# Patient Record
Sex: Female | Born: 1990 | Race: White | Hispanic: No | Marital: Single | Smoking: Never smoker
Health system: Southern US, Community
[De-identification: ages and names within clinical notes are randomized; demographics above are authoritative.]

## PROBLEM LIST (undated history)

## (undated) DIAGNOSIS — D649 Anemia, unspecified: Secondary | ICD-10-CM

## (undated) HISTORY — PX: TEMPOROMANDIBULAR JOINT SURGERY: SHX35

---

## 2016-10-22 ENCOUNTER — Emergency Department (HOSPITAL_BASED_OUTPATIENT_CLINIC_OR_DEPARTMENT_OTHER): Payer: 59

## 2016-10-22 ENCOUNTER — Emergency Department (HOSPITAL_BASED_OUTPATIENT_CLINIC_OR_DEPARTMENT_OTHER)
Admission: EM | Admit: 2016-10-22 | Discharge: 2016-10-22 | Disposition: A | Discharge status: Home / Self Care | Payer: 59 | Attending: Emergency Medicine | Admitting: Emergency Medicine

## 2016-10-22 ENCOUNTER — Encounter (HOSPITAL_BASED_OUTPATIENT_CLINIC_OR_DEPARTMENT_OTHER): Payer: Self-pay | Admitting: Emergency Medicine

## 2016-10-22 DIAGNOSIS — J029 Acute pharyngitis, unspecified: Secondary | ICD-10-CM | POA: Insufficient documentation

## 2016-10-22 DIAGNOSIS — R197 Diarrhea, unspecified: Secondary | ICD-10-CM | POA: Diagnosis not present

## 2016-10-22 DIAGNOSIS — Z79899 Other long term (current) drug therapy: Secondary | ICD-10-CM | POA: Diagnosis not present

## 2016-10-22 HISTORY — DX: Anemia, unspecified: D64.9

## 2016-10-22 LAB — RAPID STREP SCREEN (MED CTR MEBANE ONLY): Streptococcus, Group A Screen (Direct): NEGATIVE

## 2016-10-22 LAB — CBC WITH DIFFERENTIAL/PLATELET
BASOS PCT: 0 %
Basophils Absolute: 0 10*3/uL (ref 0.0–0.1)
EOS ABS: 0 10*3/uL (ref 0.0–0.7)
EOS PCT: 0 %
HCT: 42.7 % (ref 36.0–46.0)
Hemoglobin: 14.3 g/dL (ref 12.0–15.0)
Lymphocytes Relative: 12 %
Lymphs Abs: 1.3 10*3/uL (ref 0.7–4.0)
MCH: 30.6 pg (ref 26.0–34.0)
MCHC: 33.5 g/dL (ref 30.0–36.0)
MCV: 91.2 fL (ref 78.0–100.0)
MONO ABS: 0.9 10*3/uL (ref 0.1–1.0)
MONOS PCT: 8 %
NEUTROS PCT: 80 %
Neutro Abs: 8.7 10*3/uL — ABNORMAL HIGH (ref 1.7–7.7)
PLATELETS: 211 10*3/uL (ref 150–400)
RBC: 4.68 MIL/uL (ref 3.87–5.11)
RDW: 13.2 % (ref 11.5–15.5)
WBC: 10.8 10*3/uL — ABNORMAL HIGH (ref 4.0–10.5)

## 2016-10-22 LAB — MONONUCLEOSIS SCREEN: MONO SCREEN: NEGATIVE

## 2016-10-22 MED ORDER — SODIUM CHLORIDE 0.9 % IV BOLUS (SEPSIS)
1000.0000 mL | Freq: Once | INTRAVENOUS | Status: AC
  Administered 2016-10-22: 1000 mL via INTRAVENOUS

## 2016-10-22 NOTE — ED Provider Notes (Signed)
[facility] Note   CSN: [id number] Arrival date & time: 10/22/16  1510  By signing my name below, I, Modena JanskyAlbert Thayil, attest that this documentation has been prepared under the direction and in the presence of Rolan BuccoMelanie Lory Galan, MD . Electronically Signed: Modena JanskyAlbert Thayil, Scribe. 10/22/2016. 4:21 PM.  History   Chief Complaint Chief Complaint  Patient presents with  . Sore Throat   The history is provided by the patient. No language interpreter was used.   HPI Comments: Paige Pugh is a 25 y.o. female who presents to the Emergency Department complaining of a constant moderate sore throat that started 3 days ago. She states she had flu-like symptoms starting 2 weeks ago and she started taking antibiotic medication 10 days ago. She reports she has white pus in her throat. She took ibuprofen with minimal relief. She reports associated symptoms of congestion (nasal and chest), diaphoresis, diarrhea, decreased appetite, and recent weight loss (~10lbs over the past month). 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 She denies any nausea or vomiting.  She states that she has had recurrent infections over last year of flu, strept throat, impetigo and staph skin infections and is concerned about an underlying problem with her immune system.  PCP: [facility]  Past Medical History:  Diagnosis Date  . Anemia     There are no active problems to display for this patient.   Past Surgical History:  Procedure Laterality Date  . TEMPOROMANDIBULAR JOINT SURGERY      OB History    No data available       Home Medications    Prior to Admission medications   Medication Sig Start Date End Date Taking? Authorizing Provider  amphetamine-dextroamphetamine (ADDERALL) 5 MG tablet Take 5 mg by mouth daily as needed.   Yes Historical Provider, MD    Family History No family history on file.  Social History Social History    Substance Use Topics  . Smoking status: Never Smoker  . Smokeless tobacco: Never Used  . Alcohol use No     Allergies   Patient has no known allergies.   Review of Systems Review of Systems  Constitutional: Positive for appetite change, diaphoresis and unexpected weight change. Negative for chills, fatigue and fever.  HENT: Positive for congestion (Nasal, congestion). Negative for rhinorrhea and sneezing.   Eyes: Negative.   Respiratory: Negative for cough, chest tightness and shortness of breath.   Cardiovascular: Negative for chest pain and leg swelling.  Gastrointestinal: Positive for diarrhea. Negative for abdominal pain, blood in stool, nausea and vomiting.  Genitourinary: Negative for difficulty urinating, flank pain, frequency and hematuria.  Musculoskeletal: Negative for arthralgias and back pain.  Skin: Negative for rash.  Neurological: Negative for dizziness, speech difficulty, weakness, numbness and headaches.     Physical Exam Updated Vital Signs BP 105/69 (BP Location: Left Arm)   Pulse 107   Temp 98.4 F (36.9 C) (Oral)   Resp 20   Ht 5\' 6"  (1.676 m)   Wt 106 lb (48.1 kg)   LMP 10/19/2016 (Approximate)   SpO2 100%   BMI 17.11 kg/m   Physical Exam  Constitutional: She is oriented to person, place, and time. She appears well-developed and well-nourished.  HENT:  Head: Normocephalic and atraumatic.  Right Ear: Tympanic membrane normal.  Left Ear: Tympanic membrane normal.  Enlarged tonsils bilaterally with exudates. Uvula midline. No trismus.   Eyes: Pupils are equal, round,  and reactive to light.  Neck: Normal range of motion. Neck supple.  Cardiovascular: Normal rate, regular rhythm and normal heart sounds.   Pulmonary/Chest: Effort normal and breath sounds normal. No respiratory distress. She has no wheezes. She has no rales. She exhibits no tenderness.  Abdominal: Soft. Bowel sounds are normal. There is no tenderness. There is no rebound and no  guarding.  No obvious hepatosplenomegaly.   Musculoskeletal: Normal range of motion. She exhibits no edema.  Lymphadenopathy:    She has no cervical adenopathy.  Neurological: She is alert and oriented to person, place, and time.  Skin: Skin is warm and dry. No rash noted.  Psychiatric: She has a normal mood and affect.     ED Treatments / Results  DIAGNOSTIC STUDIES: Oxygen Saturation is 100% on RA, normal by my interpretation.    COORDINATION OF CARE: 4:28 PM- Pt advised of plan for treatment and pt agrees.  Labs (all labs ordered are listed, but only abnormal results are displayed) Labs Reviewed  CBC WITH DIFFERENTIAL/PLATELET - Abnormal; Notable for the following:       Result Value   WBC 10.8 (*)    Neutro Abs 8.7 (*)    All other components within normal limits  RAPID STREP SCREEN (NOT AT [facility])  CULTURE, GROUP A STREP [facility]  MONONUCLEOSIS SCREEN    EKG  EKG Interpretation None       Radiology Dg Chest 2 View  Result Date: 10/22/2016 CLINICAL DATA:  Diaphoresis, head throbbing, diarrhea, weight loss, pus in throat. EXAM: CHEST  2 VIEW COMPARISON:  None. FINDINGS: Trachea is midline. Heart size normal. Lungs are hyperinflated but clear. No pleural fluid. IMPRESSION: Hyperinflation without acute finding. Electronically Signed   By: Leanna BattlesMelinda  Blietz M.D.   On: 10/22/2016 16:49    Procedures Procedures (including critical care time)  Medications Ordered in ED Medications  sodium chloride 0.9 % bolus 1,000 mL (1,000 mLs Intravenous New Bag/Given 10/22/16 1707)     Initial Impression / Assessment and Plan / ED Course  I have reviewed the triage vital signs and the nursing notes.  Pertinent labs & imaging results that were available during my care of the patient were reviewed by me and considered in my medical decision making (see chart for details).  Clinical Course     Patient presents with pharyngitis symptoms. Given that she's had recurrent infections,  I did do some lab work including a CBC which showed only mildly elevated white count. Her Monospot is negative. Her chest x-ray is clear without evidence of pneumonia. She's feeling better after some IV fluids. She's afebrile with no suggestions of sepsis. Her heart rate has normalized. She was discharged home in good condition. She was encouraged to have follow-up with her PCP who is with [facility]. Return precautions were given.  Final Clinical Impressions(s) / ED Diagnoses   Final diagnoses:  Pharyngitis, unspecified etiology    New Prescriptions New Prescriptions   No medications on file   I personally performed the services described in this documentation, which was scribed in my presence.  The recorded information has been reviewed and considered.     Rolan BuccoMelanie Isabele Lollar, MD 10/22/16 [phone]

## 2016-10-22 NOTE — ED Triage Notes (Signed)
Pt reports dx of flu 2 weeks ago.  Pt states for past 3 days throat has been sore and decreased appetite along with profuse sweating.

## 2016-10-22 NOTE — ED Notes (Signed)
EDMD at bedside

## 2016-10-25 LAB — CULTURE, GROUP A STREP (THRC)

## 2018-05-19 IMAGING — CR DG CHEST 2V
2 series · 2 of 2 positions shown · non-contrast
Comparison: None.

CLINICAL DATA: Diaphoresis, head throbbing, diarrhea, weight loss,
pus in throat.

EXAM:
CHEST  2 VIEW

[w chest pa]
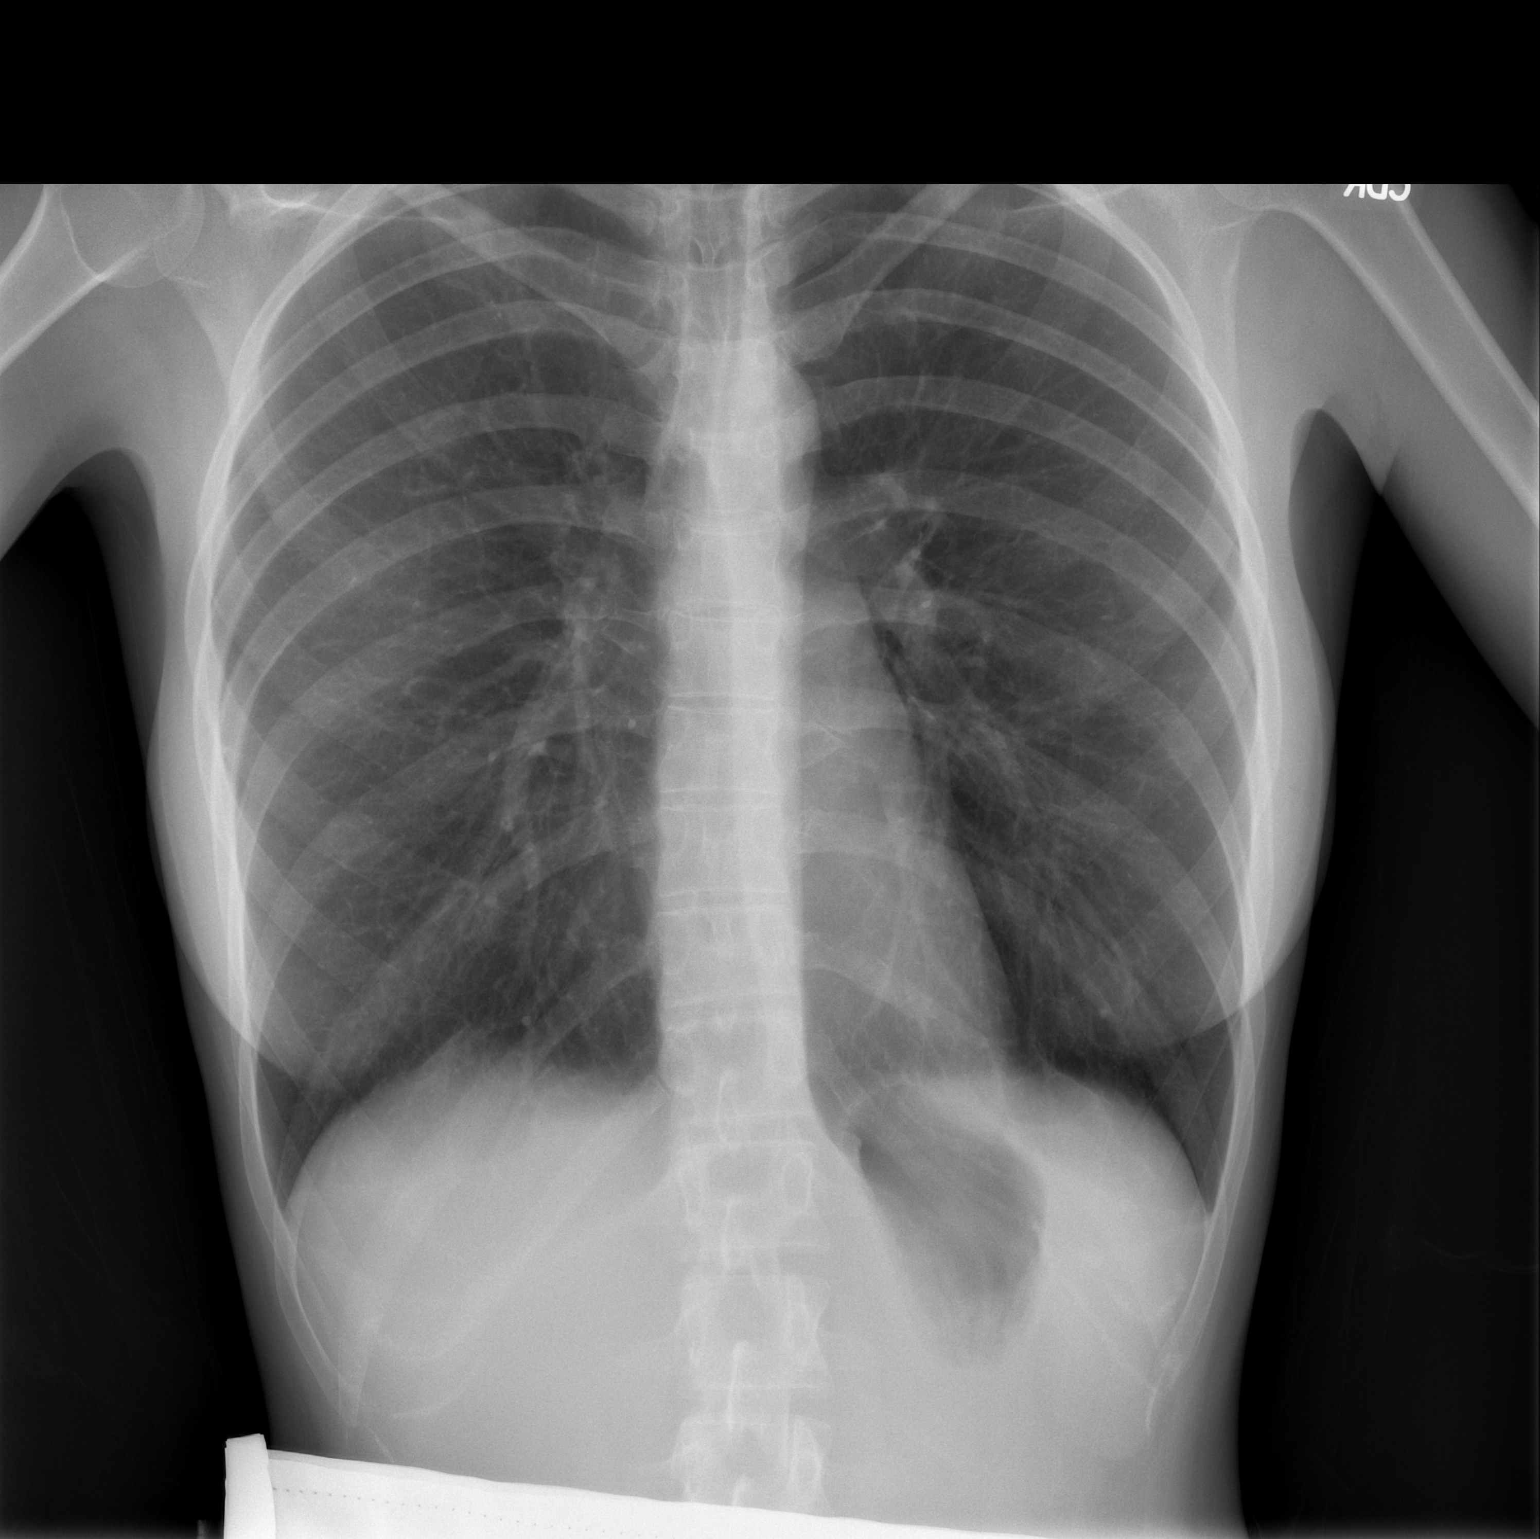

[w chest lat]
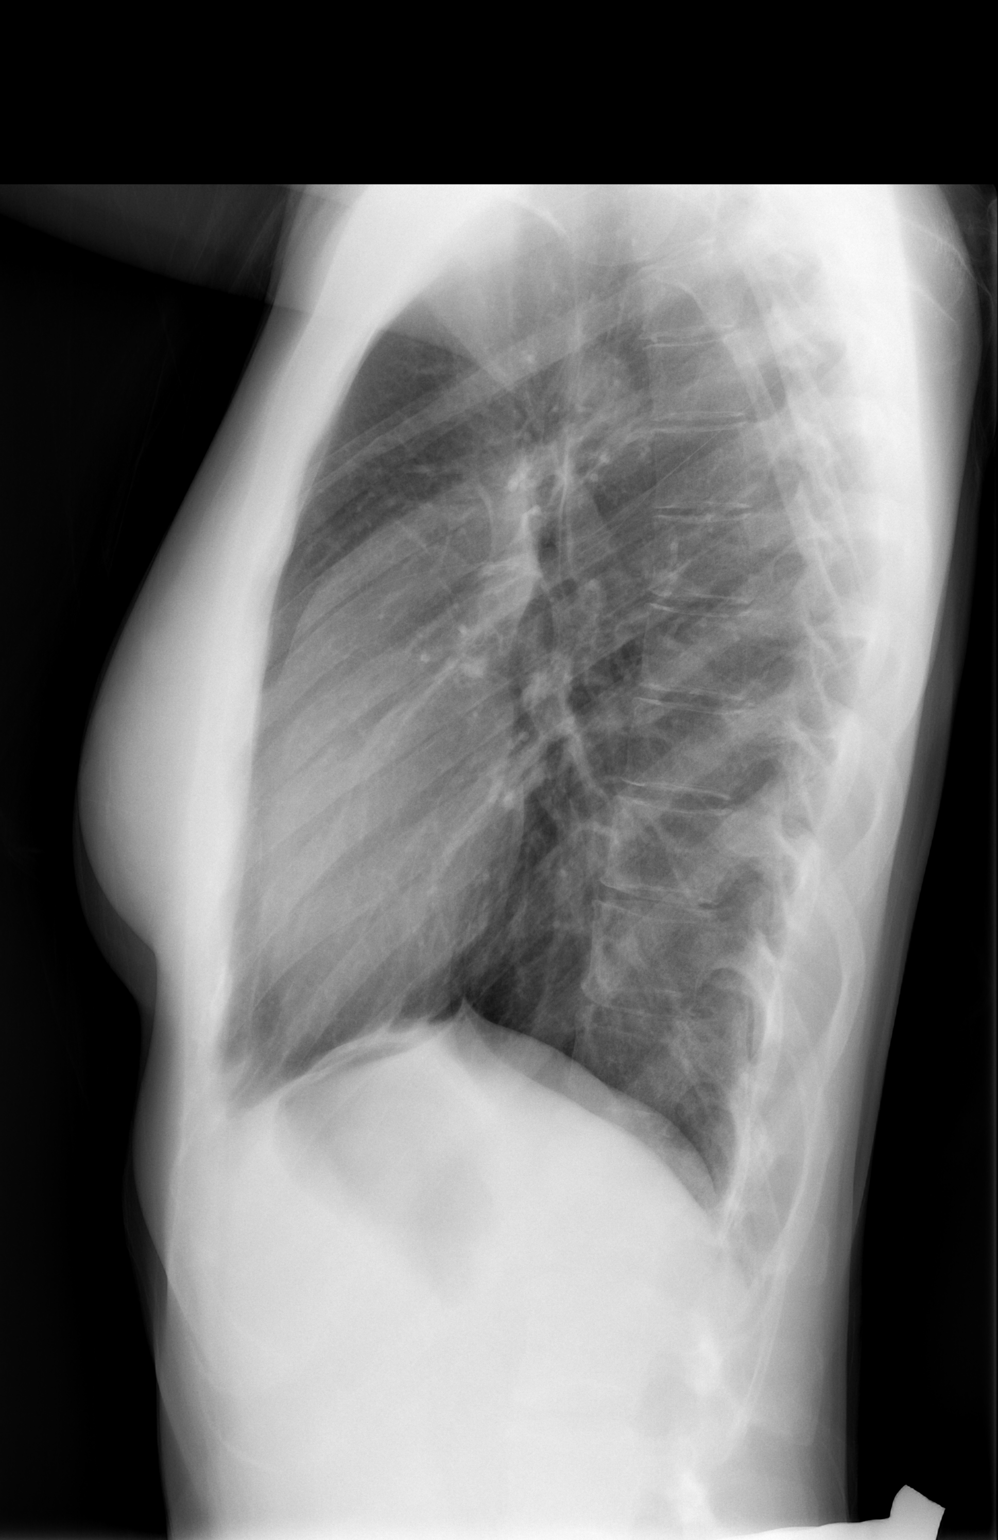

[2 of 2 positions shown; findings below may reference images not displayed]

FINDINGS: Trachea is midline. Heart size normal. Lungs are hyperinflated but
clear. No pleural fluid.
IMPRESSION: Hyperinflation without acute finding.
